# Patient Record
Sex: Male | Born: 1970 | Race: White | Hispanic: No | Marital: Married | Smoking: Never smoker
Health system: Southern US, Community
[De-identification: ages and names within clinical notes are randomized; demographics above are authoritative.]

## PROBLEM LIST (undated history)

## (undated) DIAGNOSIS — L409 Psoriasis, unspecified: Secondary | ICD-10-CM

## (undated) HISTORY — PX: HEMORRHOID BANDING: SHX5850

## (undated) HISTORY — DX: Psoriasis, unspecified: L40.9

## (undated) HISTORY — PX: COLONOSCOPY: SHX174

---

## 2016-02-29 ENCOUNTER — Other Ambulatory Visit: Payer: Self-pay | Admitting: Family Medicine

## 2016-02-29 DIAGNOSIS — R51 Headache: Principal | ICD-10-CM

## 2016-02-29 DIAGNOSIS — R519 Headache, unspecified: Secondary | ICD-10-CM

## 2016-03-03 ENCOUNTER — Other Ambulatory Visit: Payer: Self-pay

## 2016-03-08 ENCOUNTER — Other Ambulatory Visit: Payer: Self-pay

## 2016-03-20 HISTORY — PX: COLONOSCOPY: SHX174

## 2016-03-21 ENCOUNTER — Ambulatory Visit
Admission: RE | Admit: 2016-03-21 | Discharge: 2016-03-21 | Disposition: A | Discharge status: Home / Self Care | Payer: 59 | Source: Ambulatory Visit | Attending: Family Medicine | Admitting: Family Medicine

## 2016-03-21 DIAGNOSIS — R51 Headache: Secondary | ICD-10-CM | POA: Diagnosis not present

## 2016-03-21 DIAGNOSIS — R519 Headache, unspecified: Secondary | ICD-10-CM

## 2016-03-27 DIAGNOSIS — K642 Third degree hemorrhoids: Secondary | ICD-10-CM | POA: Diagnosis not present

## 2016-03-27 DIAGNOSIS — K921 Melena: Secondary | ICD-10-CM | POA: Diagnosis not present

## 2016-04-20 DIAGNOSIS — K648 Other hemorrhoids: Secondary | ICD-10-CM | POA: Diagnosis not present

## 2016-05-11 ENCOUNTER — Ambulatory Visit: Payer: Self-pay | Admitting: Family Medicine

## 2016-05-25 DIAGNOSIS — K642 Third degree hemorrhoids: Secondary | ICD-10-CM | POA: Diagnosis not present

## 2016-06-08 DIAGNOSIS — K642 Third degree hemorrhoids: Secondary | ICD-10-CM | POA: Diagnosis not present

## 2016-09-26 DIAGNOSIS — K642 Third degree hemorrhoids: Secondary | ICD-10-CM | POA: Diagnosis not present

## 2016-11-11 ENCOUNTER — Emergency Department (HOSPITAL_COMMUNITY): Payer: 59

## 2016-11-11 ENCOUNTER — Encounter (HOSPITAL_COMMUNITY): Payer: Self-pay | Admitting: Emergency Medicine

## 2016-11-11 ENCOUNTER — Emergency Department (HOSPITAL_COMMUNITY)
Admission: EM | Admit: 2016-11-11 | Discharge: 2016-11-11 | Disposition: A | Discharge status: Home / Self Care | Payer: 59 | Attending: Emergency Medicine | Admitting: Emergency Medicine

## 2016-11-11 DIAGNOSIS — R0789 Other chest pain: Secondary | ICD-10-CM | POA: Diagnosis not present

## 2016-11-11 DIAGNOSIS — S2242XA Multiple fractures of ribs, left side, initial encounter for closed fracture: Secondary | ICD-10-CM | POA: Diagnosis not present

## 2016-11-11 DIAGNOSIS — Y929 Unspecified place or not applicable: Secondary | ICD-10-CM | POA: Insufficient documentation

## 2016-11-11 DIAGNOSIS — S20412A Abrasion of left back wall of thorax, initial encounter: Secondary | ICD-10-CM | POA: Diagnosis not present

## 2016-11-11 DIAGNOSIS — Y998 Other external cause status: Secondary | ICD-10-CM | POA: Insufficient documentation

## 2016-11-11 DIAGNOSIS — Y939 Activity, unspecified: Secondary | ICD-10-CM | POA: Insufficient documentation

## 2016-11-11 DIAGNOSIS — Z79899 Other long term (current) drug therapy: Secondary | ICD-10-CM | POA: Diagnosis not present

## 2016-11-11 DIAGNOSIS — W19XXXA Unspecified fall, initial encounter: Secondary | ICD-10-CM | POA: Insufficient documentation

## 2016-11-11 LAB — URINALYSIS, ROUTINE W REFLEX MICROSCOPIC
BACTERIA UA: NONE SEEN
BILIRUBIN URINE: NEGATIVE
Glucose, UA: NEGATIVE mg/dL
HGB URINE DIPSTICK: NEGATIVE
KETONES UR: NEGATIVE mg/dL
LEUKOCYTES UA: NEGATIVE
NITRITE: NEGATIVE
PH: 7 (ref 5.0–8.0)
SPECIFIC GRAVITY, URINE: 1.015 (ref 1.005–1.030)
SQUAMOUS EPITHELIAL / LPF: NONE SEEN

## 2016-11-11 MED ORDER — KETOROLAC TROMETHAMINE 30 MG/ML IJ SOLN
15.0000 mg | Freq: Once | INTRAMUSCULAR | Status: AC
  Administered 2016-11-11: 15 mg via INTRAMUSCULAR
  Filled 2016-11-11: qty 1

## 2016-11-11 MED ORDER — HYDROCODONE-ACETAMINOPHEN 5-325 MG PO TABS: 1.0000 | ORAL_TABLET | Freq: Four times a day (QID) | ORAL | 0 refills | Status: DC | PRN

## 2016-11-11 MED ORDER — IBUPROFEN 400 MG PO TABS: 400.0000 mg | ORAL_TABLET | Freq: Three times a day (TID) | ORAL | 0 refills | Status: AC

## 2016-11-11 NOTE — ED Triage Notes (Addendum)
Pt reports he fell down a flight of stairs at midnight last night. Pt reports L side chest wall pain. Pt also reports he has a bruise on the back of his head from hitting head. May have had LOC. Not on blood thinners. Denies neck pain. Ambulatory to triage. Abrasion noted to L flank area.

## 2016-11-11 NOTE — ED Notes (Signed)
Pt instructed and demonstrated the use of incentive spirometer, able to preform without difficulty to 2500 ml. Pt instructed to use every 2 hours while awake and verbal agrees.

## 2016-11-11 NOTE — ED Provider Notes (Signed)
[facility]-EMERGENCY DEPT Provider Note   CSN: [id number] Arrival date & time: 11/11/16  0727     History   Chief Complaint Chief Complaint  Patient presents with  . Fall    HPI RAVI KAUK is a 46 y.o. male.   Fall     Patient presents the morning after a fall. Patient is healthy, takes no blood thinning medication, was in his usual state of health. Last night, approximately 5 hours ago the patient slipped, fell onto the top of the staircase, and subsequently down the stairs. He recalls hitting his left thoracic paraspinal area, left  Inferior axilla, and posterior head. No loss of consciousness. No subsequent confusion, disorientation, neck pain, vision changes, weakness in any extremity. No relief from his severe sore cramp-like pain that is along the left side, with ibuprofen.   History reviewed. No pertinent past medical history.  There are no active problems to display for this patient.   History reviewed. No pertinent surgical history.     Home Medications    Prior to Admission medications   Medication Sig Start Date End Date Taking? Authorizing Provider  acetaminophen (TYLENOL) 500 MG tablet Take 1,000-1,500 mg by mouth every 6 (six) hours as needed for mild pain or moderate pain.   Yes [provider]  Chlorpheniramine-PSE-Ibuprofen (ADVIL ALLERGY SINUS) 2-30-200 MG TABS Take 1 tablet by mouth daily as needed (congestion).   Yes [provider]    Family History History reviewed. No pertinent family history.  Social History Social History  Substance Use Topics  . Smoking status: Never Smoker  . Smokeless tobacco: Never Used  . Alcohol use No     Allergies   Patient has no known allergies.   Review of Systems Review of Systems  Constitutional:       Per HPI, otherwise negative  HENT:       Per HPI, otherwise negative  Respiratory:       Per HPI, otherwise negative  Cardiovascular:       Per HPI, otherwise  negative  Gastrointestinal: Negative for vomiting.  Endocrine:       Negative aside from HPI  Genitourinary:       Neg aside from HPI   Musculoskeletal:       Per HPI, otherwise negative  Skin: Positive for color change and wound.  Neurological: Negative for syncope and weakness.     Physical Exam Updated Vital Signs BP (!) 144/94 (BP Location: Left Arm)   Pulse 100   Temp 97.9 F (36.6 C) (Oral)   Resp 20   SpO2 97%   Physical Exam  Constitutional: He is oriented to person, place, and time. He appears well-developed. No distress.  HENT:  Head: Normocephalic and atraumatic.  No appreciable trauma posterior scalp, the patient describes a small hematoma.  Eyes: Conjunctivae and EOM are normal.  Neck: No spinous process tenderness and no muscular tenderness present. No neck rigidity. No edema, no erythema and normal range of motion present.  Cardiovascular: Normal rate and regular rhythm.   Pulmonary/Chest: Effort normal. No stridor. No respiratory distress.      Abdominal: He exhibits no distension.  Musculoskeletal: He exhibits no edema.  Neurological: He is alert and oriented to person, place, and time. He displays no atrophy and no tremor. He exhibits normal muscle tone. He displays no seizure activity. Coordination normal.  Skin: Skin is warm and dry.     Psychiatric: He has a normal mood and affect.  Nursing note  and vitals reviewed.    ED Treatments / Results   Radiology Dg Chest 2 View  Result Date: 11/11/2016 CLINICAL DATA:  Left flank and left rib pain.  Fall last night. EXAM: CHEST  2 VIEW COMPARISON:  None. FINDINGS: There is opacity in the left base which is somewhat platelike in streaky appearance. More focal opacity is seen in the right infrahilar region on the frontal view. No other infiltrate identified. No nodule or mass. The cardiomediastinal silhouette is unremarkable. There is a fracture through the posterolateral left eighth and ninth ribs. The  seventh rib may be involved but is less certain. No other acute abnormalities. IMPRESSION: 1. Left-sided rib fractures as above. 2. Streaky opacity in left base is probably atelectasis. 3. More focal opacity in the right infrahilar region could be atelectasis or infiltrate. Recommend short-term follow-up to ensure resolution. Electronically Signed   By: Gerome Sam III M.D   On: 11/11/2016 08:12   Ct Chest Wo Contrast  Result Date: 11/11/2016 CLINICAL DATA:  Larey Seat down stairs last night. Chest pain. Multiple left rib fractures. Initial encounter. EXAM: CT CHEST WITHOUT CONTRAST TECHNIQUE: Multidetector CT imaging of the chest was performed following the standard protocol without IV contrast. COMPARISON:  None. FINDINGS: Cardiovascular: No evidence of mediastinal hematoma. No pericardial effusion. No evidence thoracic aortic aneurysm. Mediastinum/Nodes: No masses or pathologically enlarged lymph nodes identified. Lungs/Pleura: No evidence of pulmonary contusion or other infiltrate. No evidence of pneumothorax or hemothorax. Linear opacities in the dependent portions of both lower lobes may be due to atelectasis or scarring. Upper Abdomen:  No acute findings. Musculoskeletal: Minimally displaced fractures of left lateral seventh through ninth ribs seen. No other fractures identified. IMPRESSION: Minimally displaced fractures of left lateral seventh through ninth ribs. No other traumatic injury identified. Dependent bilateral lower lobe atelectasis versus infiltrates . Electronically Signed   By: Myles Rosenthal M.D.   On: 11/11/2016 10:17    Procedures Procedures (including critical care time)  Medications Ordered in ED Medications  ketorolac (TORADOL) 30 MG/ML injection 15 mg (not administered)     Initial Impression / Assessment and Plan / ED Course  I have reviewed the triage vital signs and the nursing notes.  Pertinent labs & imaging results that were available during my care of the patient were  reviewed by me and considered in my medical decision making (see chart for details).   On repeat exam the patient is awake and alert. I discussed all findings with the patient and his wife. With x-ray evidence, CT evidence of 3 rib fractures, but no evidence for pneumothorax, hemothorax, nor urinalysis suggesting renal injury, the patient is appropriate for close outpatient follow-up. Patient has received both analgesia and incentive spirometry with instructions on use.  Patient did strike his head, but is not on anticoagulant, has no neurologic deficiencies, and no appreciable evidence for trauma. C-spine evaluation also reassuring.    Final Clinical Impressions(s) / ED Diagnoses   Final diagnoses:  Fall, initial encounter  Closed fracture of multiple ribs of left side, initial encounter    New Prescriptions New Prescriptions   HYDROCODONE-ACETAMINOPHEN (NORCO/VICODIN) 5-325 MG TABLET    Take 1 tablet by mouth every 6 (six) hours as needed for severe pain.   IBUPROFEN (ADVIL,MOTRIN) 400 MG TABLET    Take 1 tablet (400 mg total) by mouth 3 (three) times daily. Take one tablet three times daily for three days     Gerhard Munch, MD 11/11/16 1130

## 2017-11-07 DIAGNOSIS — Z125 Encounter for screening for malignant neoplasm of prostate: Secondary | ICD-10-CM | POA: Diagnosis not present

## 2017-11-07 DIAGNOSIS — Z0000 Encounter for general adult medical examination without abnormal findings: Secondary | ICD-10-CM | POA: Diagnosis not present

## 2017-11-07 DIAGNOSIS — Z23 Encounter for immunization: Secondary | ICD-10-CM | POA: Diagnosis not present

## 2017-12-03 DIAGNOSIS — H1030 Unspecified acute conjunctivitis, unspecified eye: Secondary | ICD-10-CM | POA: Diagnosis not present

## 2018-09-13 IMAGING — CT CT CHEST W/O CM
2 of 4 series · 15 of 36 positions shown, 18 images · non-contrast
Comparison: None.

CLINICAL DATA: Fell down stairs last night. Chest pain. Multiple
left rib fractures. Initial encounter.

EXAM:
CT CHEST WITHOUT CONTRAST
TECHNIQUE: Multidetector CT imaging of the chest was performed following the
standard protocol without IV contrast.

[Series 2: chest w/o st · axial · non-contrast · 0.84mm/px · z∈[+1590,+1862]mm · 12 of 162 slices shown, 15 images]
[im 13/162  mediastinal]
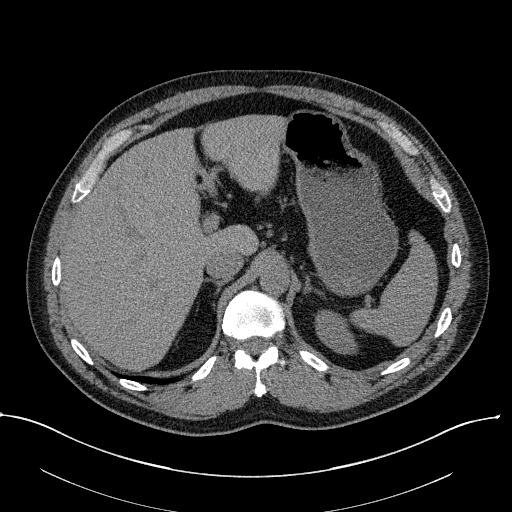
[im 13/162  lung]
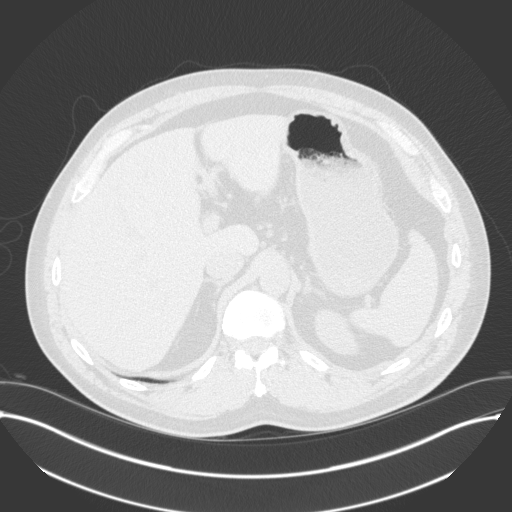
[im 25/162  lung]
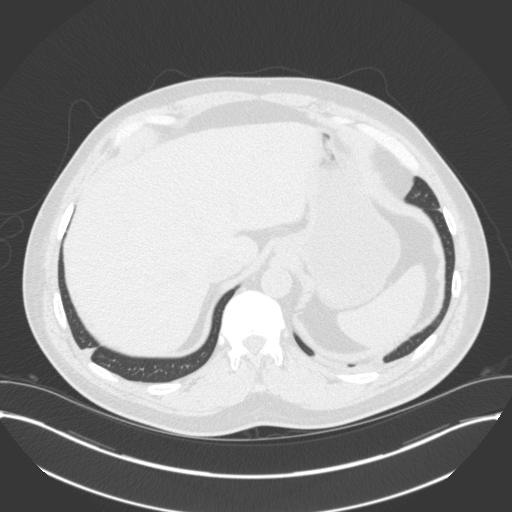
[im 38/162  lung]
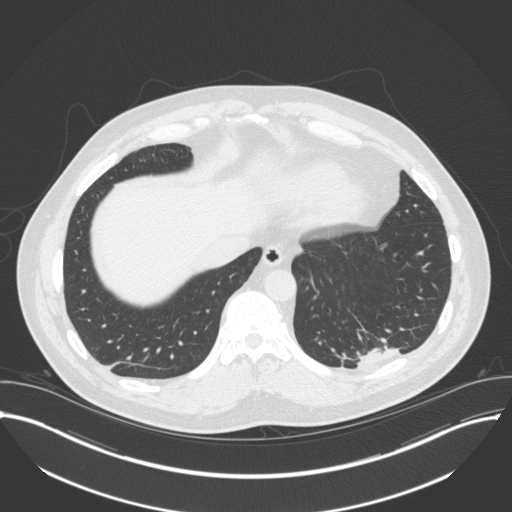
[im 50/162  lung]
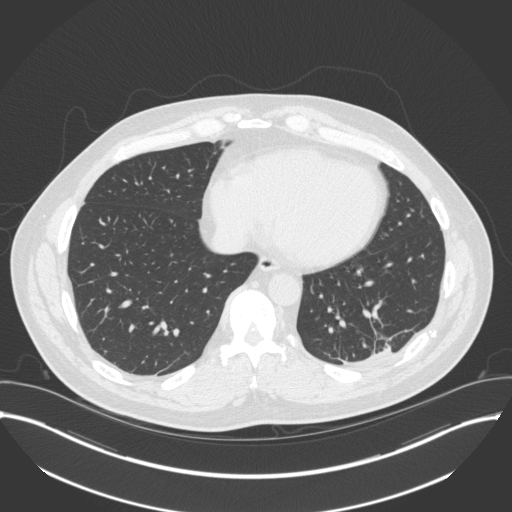
[im 62/162  mediastinal]
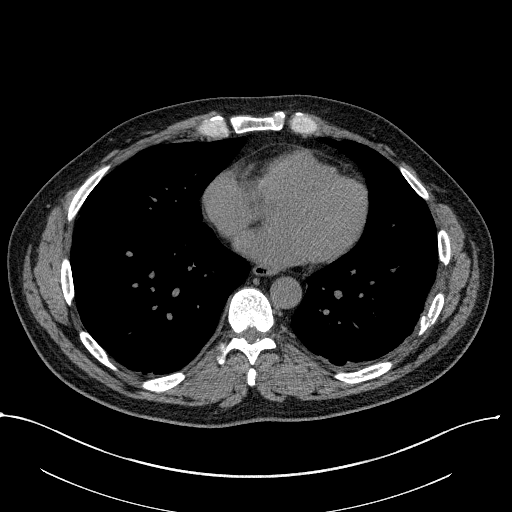
[im 62/162  lung]
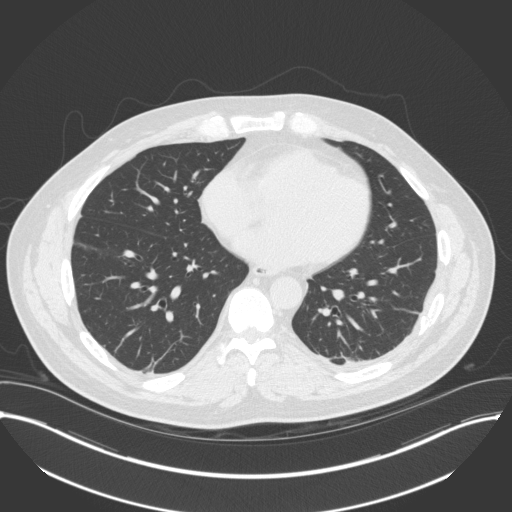
[im 75/162  lung]
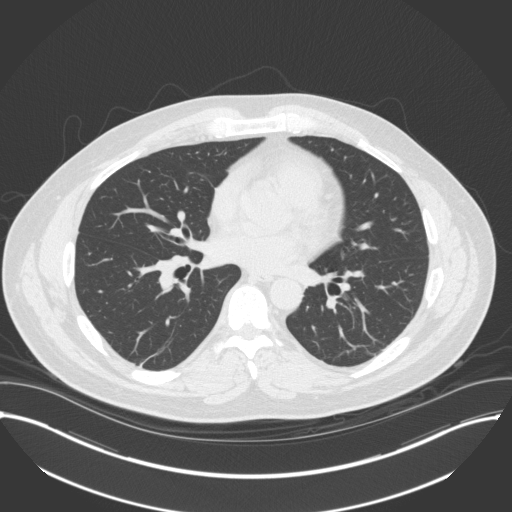
[im 87/162  lung]
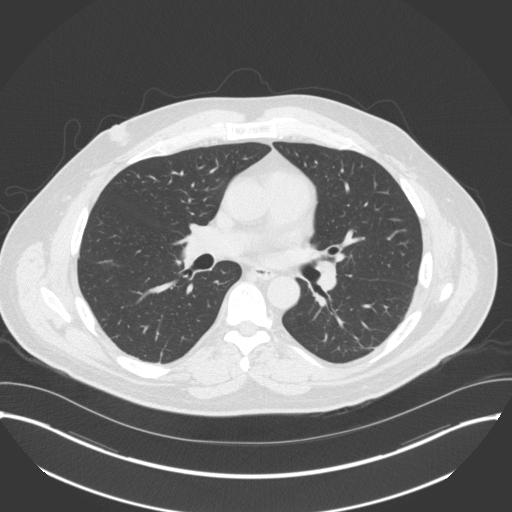
[im 100/162  lung]
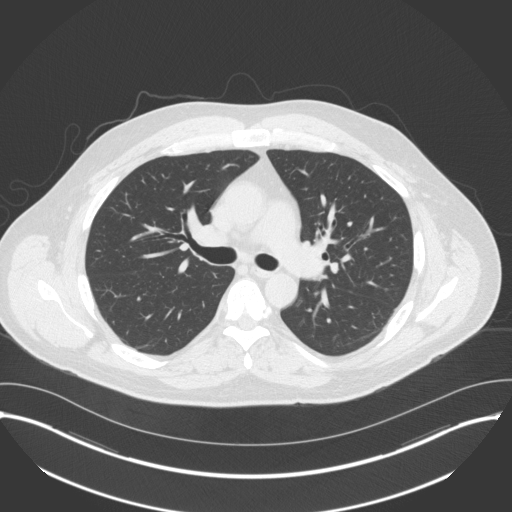
[im 112/162  mediastinal]
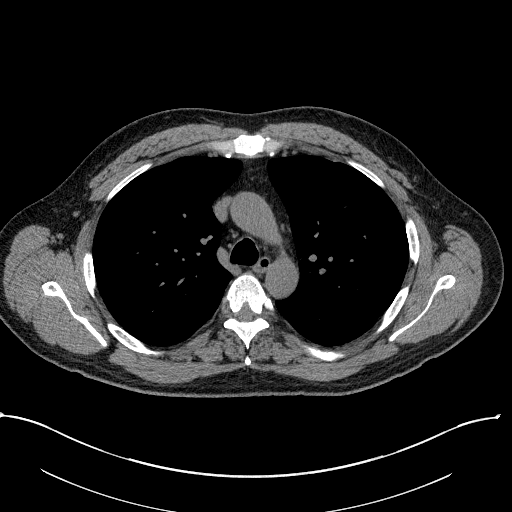
[im 112/162  lung]
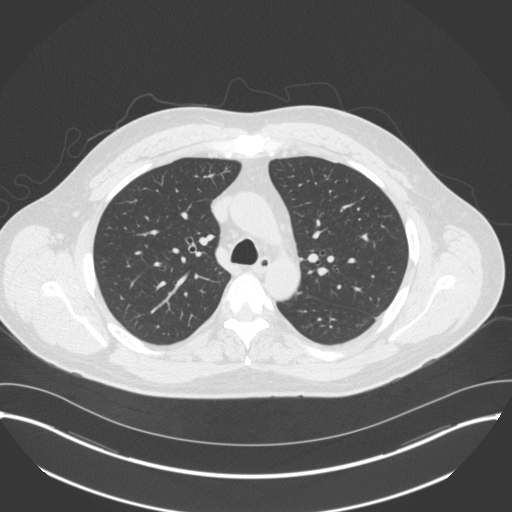
[im 124/162  lung]
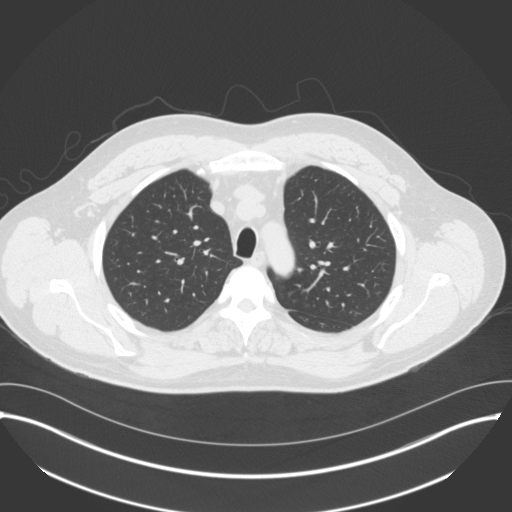
[im 137/162  lung]
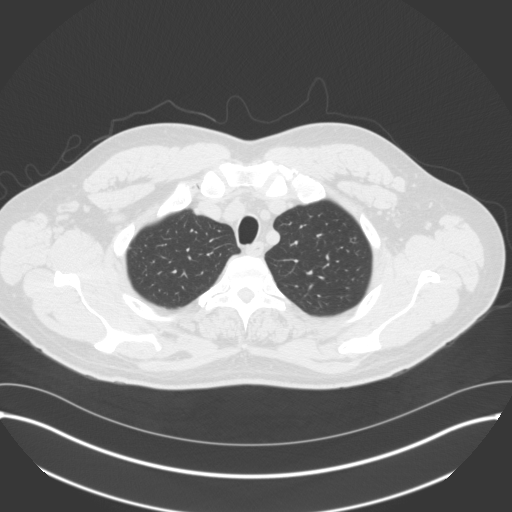
[im 149/162  lung]
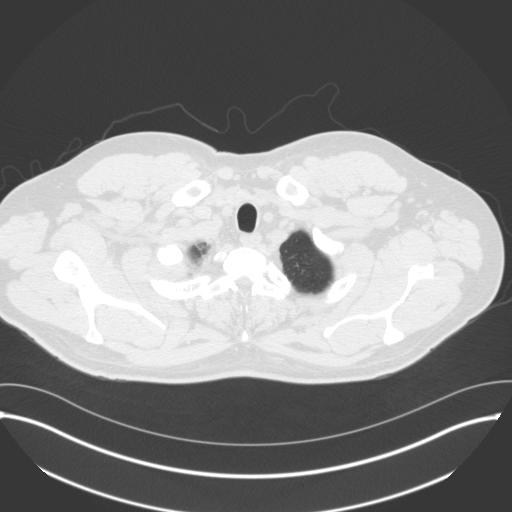

[Series 5: coronals · coronal · 0.66mm/px · 3 of 166 slices shown]
[im 34/166  lung]
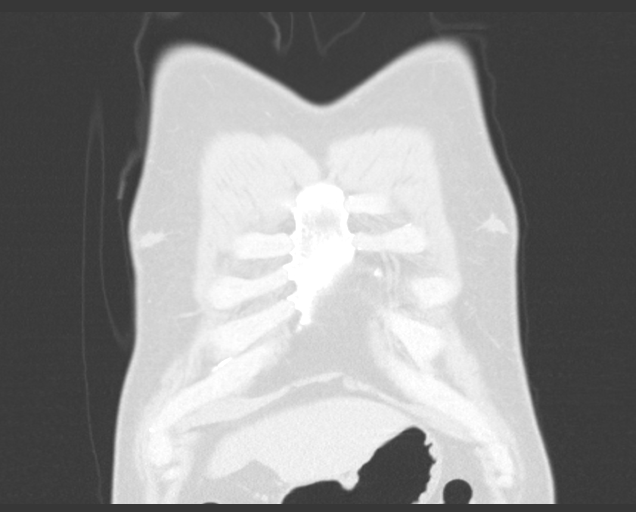
[im 67/166  lung]
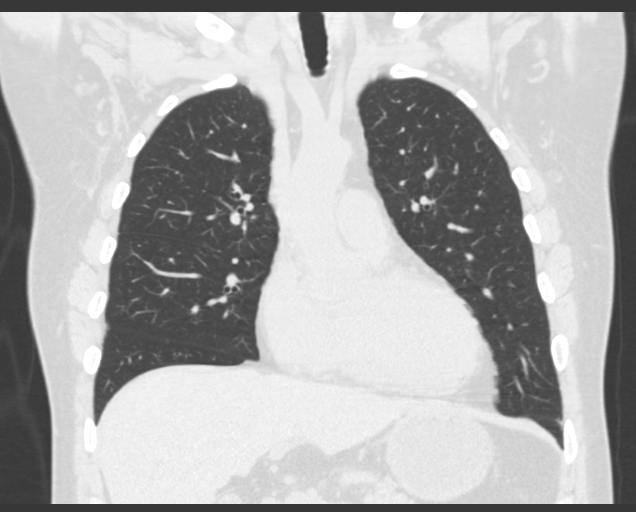
[im 100/166  lung]
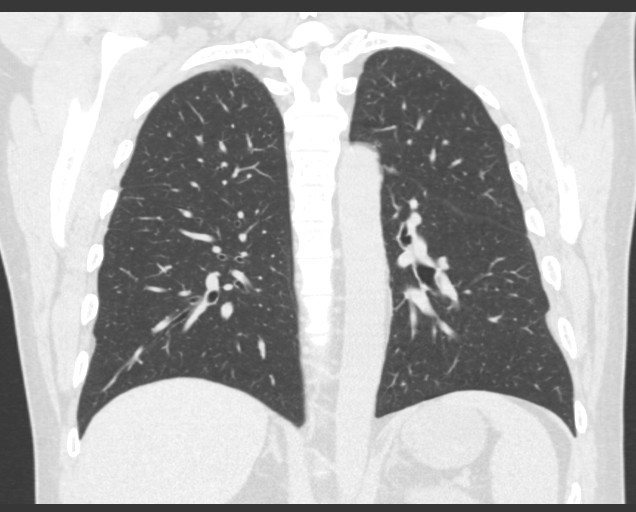

[15 of 36 positions shown; findings below may reference images not displayed]

FINDINGS: Cardiovascular: No evidence of mediastinal hematoma. No pericardial
effusion. No evidence thoracic aortic aneurysm.

Mediastinum/Nodes: No masses or pathologically enlarged lymph nodes
identified.

Lungs/Pleura: No evidence of pulmonary contusion or other
infiltrate. No evidence of pneumothorax or hemothorax. Linear
opacities in the dependent portions of both lower lobes may be due
to atelectasis or scarring.

Upper Abdomen:  No acute findings.

Musculoskeletal: Minimally displaced fractures of left lateral
seventh through ninth ribs seen. No other fractures identified.
IMPRESSION: Minimally displaced fractures of left lateral seventh through ninth
ribs. No other traumatic injury identified.

Dependent bilateral lower lobe atelectasis versus infiltrates .

## 2022-09-29 ENCOUNTER — Emergency Department
Admission: EM | Admit: 2022-09-29 | Discharge: 2022-09-29 | Disposition: A | Discharge status: Home / Self Care | Payer: Managed Care, Other (non HMO) | Attending: Emergency Medicine | Admitting: Emergency Medicine

## 2022-09-29 ENCOUNTER — Other Ambulatory Visit: Payer: Self-pay

## 2022-09-29 ENCOUNTER — Encounter: Payer: Self-pay | Admitting: Emergency Medicine

## 2022-09-29 DIAGNOSIS — Z203 Contact with and (suspected) exposure to rabies: Secondary | ICD-10-CM

## 2022-09-29 DIAGNOSIS — Z2914 Encounter for prophylactic rabies immune globin: Secondary | ICD-10-CM | POA: Insufficient documentation

## 2022-09-29 DIAGNOSIS — Z23 Encounter for immunization: Secondary | ICD-10-CM | POA: Diagnosis not present

## 2022-09-29 MED ORDER — RABIES VACCINE, PCEC IM SUSR
1.0000 mL | Freq: Once | INTRAMUSCULAR | Status: AC
  Administered 2022-09-29: 1 mL via INTRAMUSCULAR
  Filled 2022-09-29: qty 1

## 2022-09-29 MED ORDER — RABIES IMMUNE GLOBULIN 150 UNIT/ML IM INJ
1500.0000 [IU] | INJECTION | Freq: Once | INTRAMUSCULAR | Status: AC
  Administered 2022-09-29: 1500 [IU]
  Filled 2022-09-29: qty 10

## 2022-09-29 MED ORDER — RABIES IMMUNE GLOBULIN 150 UNIT/ML IM INJ
600.0000 [IU] | INJECTION | Freq: Once | INTRAMUSCULAR | Status: AC
  Administered 2022-09-29: 600 [IU]
  Filled 2022-09-29: qty 4

## 2022-09-29 MED ORDER — RABIES IMMUNE GLOBULIN 150 UNIT/ML IM INJ: 20.0000 [IU]/kg | INJECTION | Freq: Once | INTRAMUSCULAR | Status: DC

## 2022-09-29 NOTE — ED Notes (Signed)
See triage note  States he was in back  yard   felt something buzz by his head  States he did not see anything  but thinks it may have been a bat  This happened 3 weeks ago

## 2022-09-29 NOTE — Discharge Instructions (Addendum)
The rabies vaccine does not provide lifelong protection.  Protection can last anywhere from 6 months to 2 years ending on how many doses he received.  Common side effects of rabies vaccine include pain at the injection site, skin discoloration, swelling.   Please returned on day 3 for second set of rabies vaccine.

## 2022-09-29 NOTE — ED Provider Notes (Signed)
[facility] Emergency Department Provider Note     Event Date/Time   First MD Initiated Contact with Patient 09/29/22 1544     (approximate)   History   Rabies Exposure   HPI  Cory Costa is a 52 y.o. male presents to the ED for possible rabies exposure x 3 weeks ago.  Patient reports he was doing his routine running at 4:30 AM when he believes a bat possibly grazed and made contact with his head.  He was seen by his PCP who encouraged him to follow-up with ED for rabies prophylaxis.  Denies symptoms at this time.  Patient reports he is exposed to bats when he runs and other animals that carry rabies.    Physical Exam   Triage Vital Signs: ED Triage Vitals  Encounter Vitals Group     BP 09/29/22 1523 (!) 122/90     Systolic BP Percentile --      Diastolic BP Percentile --      Pulse Rate 09/29/22 1523 76     Resp 09/29/22 1523 18     Temp 09/29/22 1525 98.3 F (36.8 C)     Temp Source 09/29/22 1525 Oral     SpO2 09/29/22 1523 98 %     Weight 09/29/22 1524 234 lb (106.1 kg)     Height 09/29/22 1524 6\' 2"  (1.88 m)     Head Circumference --      Peak Flow --      Pain Score 09/29/22 1523 0     Pain Loc --      Pain Education --      Exclude from Growth Chart --     Most recent vital signs: Vitals:   09/29/22 1523 09/29/22 1525  BP: (!) 122/90   Pulse: 76   Resp: 18   Temp:  98.3 F (36.8 C)  SpO2: 98%     General Awake, no distress.  HEENT NCAT. PERRL. EOMI. Scalp visualized. No skin lesions or rashes noted. Skin is intact.  CV:  Good peripheral perfusion.  RESP:  Normal effort.  ABD:  No distention.     ED Results / Procedures / Treatments   Labs (all labs ordered are listed, but only abnormal results are displayed) Labs Reviewed - No data to display  History and physical examination do not warrant a lab work up or imaging at this time.   No results found.  PROCEDURES:  Critical Care performed:  No  Procedures   MEDICATIONS ORDERED IN ED: Medications  rabies vaccine (RABAVERT) injection 1 mL (1 mL Intramuscular Given 09/29/22 1704)  rabies immune globulin (HYPERRAB/KEDRAB) injection 1,500 Units (1,500 Units Infiltration Given 09/29/22 1703)    And  rabies immune globulin (HYPERRAB/KEDRAB) injection 600 Units (600 Units Infiltration Given 09/29/22 1704)    IMPRESSION / MDM / ASSESSMENT AND PLAN / ED COURSE  I reviewed the triage vital signs and the nursing notes.                               52 y.o. male presents to the emergency department for evaluation of possible rabies exposure from a bat while running outside under trees. See HPI for further details.   A thorough discussion was provided. It was shared decision making in considering rabies vaccine as a prophylactic given possible rabies exposure.  The patient was administered rabies immune globulin and rabies vaccine today.  He  is to return on days 3 for second rabies vaccine (07/15).  Education package of rabies vaccine and the CDC guideline is provided for patient. All questions and concerns were addressed during this ED visit.    Patient's presentation is most consistent with acute presentation with potential threat to life or bodily function.  FINAL CLINICAL IMPRESSION(S) / ED DIAGNOSES   Final diagnoses:  Rabies exposure   Rx / DC Orders   ED Discharge Orders     None        Note:  This document was prepared using Dragon voice recognition software and may include unintentional dictation errors.    Romeo Apple, Raywood Wailes A, PA-C 09/30/22 1610    Dionne Bucy, MD 09/30/22 [phone]

## 2022-09-29 NOTE — ED Triage Notes (Signed)
Pt here with a possible rabies exposure. Pt was outside at dusk on June 20 and states a bat grazed his head and he was advised to come and be evaluated at the ED. Pt denies any symptoms.

## 2022-10-02 ENCOUNTER — Ambulatory Visit
Admission: RE | Admit: 2022-10-02 | Discharge: 2022-10-02 | Disposition: A | Discharge status: Home / Self Care | Payer: Managed Care, Other (non HMO) | Source: Ambulatory Visit | Attending: Internal Medicine | Admitting: Internal Medicine

## 2022-10-02 DIAGNOSIS — Z23 Encounter for immunization: Secondary | ICD-10-CM | POA: Diagnosis not present

## 2022-10-02 MED ORDER — RABIES VACCINE, PCEC IM SUSR
1.0000 mL | Freq: Once | INTRAMUSCULAR | Status: AC
  Administered 2022-10-02: 1 mL via INTRAMUSCULAR

## 2022-10-02 NOTE — ED Triage Notes (Signed)
Here for rabies vaccine day 3. Denies any reactions to previous injections. Did not see instructions for follow up on days 7 and 14 documented in HPI. Clarified orders with Nettie Elm PA, who clarified them with her attending. Instructed patient on follow up on days 7 (7/19) and 14 (7/26) for continuing vaccine regimen.

## 2022-10-03 MED FILL — Rabies Immune Globulin (Human) Inj 300 Unit/2ML (150 Unt/ML): INTRAMUSCULAR | Qty: 4 | Status: AC

## 2022-10-06 ENCOUNTER — Ambulatory Visit
Admission: RE | Admit: 2022-10-06 | Discharge: 2022-10-06 | Disposition: A | Discharge status: Home / Self Care | Payer: Managed Care, Other (non HMO) | Source: Ambulatory Visit | Attending: Internal Medicine | Admitting: Internal Medicine

## 2022-10-06 DIAGNOSIS — Z23 Encounter for immunization: Secondary | ICD-10-CM

## 2022-10-06 MED ORDER — RABIES VACCINE, PCEC IM SUSR
1.0000 mL | Freq: Once | INTRAMUSCULAR | Status: AC
  Administered 2022-10-06: 1 mL via INTRAMUSCULAR

## 2022-10-06 NOTE — ED Triage Notes (Signed)
Patient presents to UC for rabies vaccine day 7. States previous doses tolerated well.

## 2022-10-06 NOTE — ED Notes (Signed)
Rabies IM administered, tolerated well. Instructed patient to follow-up day 14 for last dose. Voiced understanding.

## 2022-10-13 ENCOUNTER — Ambulatory Visit
Admission: RE | Admit: 2022-10-13 | Discharge: 2022-10-13 | Disposition: A | Discharge status: Home / Self Care | Payer: Managed Care, Other (non HMO) | Source: Ambulatory Visit | Attending: Emergency Medicine | Admitting: Emergency Medicine

## 2022-10-13 DIAGNOSIS — Z203 Contact with and (suspected) exposure to rabies: Secondary | ICD-10-CM

## 2022-10-13 MED ORDER — RABIES VACCINE, PCEC IM SUSR
1.0000 mL | Freq: Once | INTRAMUSCULAR | Status: AC
  Administered 2022-10-13: 1 mL via INTRAMUSCULAR

## 2022-10-13 NOTE — ED Notes (Signed)
Tolerated vaccine administration into right deltoid without issue. Advised to follow-up if need arises.

## 2022-10-13 NOTE — ED Triage Notes (Signed)
Patient to UC for final rabies vaccine in rabies series after possibel rabies exposure via bat June 20th.

## 2022-10-17 ENCOUNTER — Telehealth: Payer: Self-pay | Admitting: Family Medicine

## 2022-10-17 NOTE — Telephone Encounter (Signed)
Ok to establish new pt appt with me. Thanks.

## 2022-10-17 NOTE — Telephone Encounter (Signed)
Scheduled pt for 11/14/22

## 2022-10-17 NOTE — Telephone Encounter (Signed)
Pt called requesting to become a new pt of Dr. Reece Agar? Pt states his father, Yacine Westenhaver, is already a current pt of Dr. Reece Agar. Is it okay to schedule new pt ov? Call back # [phone]

## 2022-11-14 ENCOUNTER — Ambulatory Visit: Payer: Managed Care, Other (non HMO) | Admitting: Family Medicine

## 2022-11-14 VITALS — BP 110/70 | HR 62 | Temp 98.0°F | Ht 74.0 in | Wt 235.0 lb

## 2022-11-14 DIAGNOSIS — Z6830 Body mass index (BMI) 30.0-30.9, adult: Secondary | ICD-10-CM | POA: Diagnosis not present

## 2022-11-14 DIAGNOSIS — E66811 Obesity, class 1: Secondary | ICD-10-CM | POA: Insufficient documentation

## 2022-11-14 DIAGNOSIS — L409 Psoriasis, unspecified: Secondary | ICD-10-CM | POA: Diagnosis not present

## 2022-11-14 DIAGNOSIS — K645 Perianal venous thrombosis: Secondary | ICD-10-CM

## 2022-11-14 DIAGNOSIS — E669 Obesity, unspecified: Secondary | ICD-10-CM

## 2022-11-14 MED ORDER — HYDROCORTISONE (PERIANAL) 2.5 % EX CREA: 1.0000 | TOPICAL_CREAM | Freq: Two times a day (BID) | CUTANEOUS | 1 refills | Status: DC | PRN

## 2022-11-14 NOTE — Assessment & Plan Note (Signed)
Reviewed overall healthy diet. He is planning to restart running regimen.

## 2022-11-14 NOTE — Assessment & Plan Note (Signed)
This is followed by dermatology

## 2022-11-14 NOTE — Progress Notes (Signed)
Ph: [phone] Fax: [phone]   Patient ID: Cory Costa, male    DOB: 01/05/1971, 52 y.o.   MRN: [id number]  This visit was conducted in person.  BP 110/70   Pulse 62   Temp 98 F (36.7 C) (Temporal)   Ht 6\' 2"  (1.88 m)   Wt 235 lb (106.6 kg)   SpO2 100%   BMI 30.17 kg/m    CC: new pt to establish care  Subjective:   HPI: Cory Costa is a 52 y.o. male presenting on 11/14/2022 for Establish Care   New pt to establish. Previously saw Dr Manus Gunning at [facility]. I see his father Cory Costa.  Works for [employer].  Just got back from cruise.  H/o rabies exposure (bat grazed head while running) last month s/p rabies immunoglobulin x4.   Current hemorrhoid acutely flared over the weekend while working in the yard. Very painful on Sunday but now it is improving. Treats with sugar crystals and petroleum jelly, warm sitz baths.   Colonoscopy through [facility] ~age 21 yo WNL  H/o banding without benefit.   Psoriasis managed with triamcinolone cream followed by Lucretia Field at [facility]).   Last CPE was last year.   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 Interim medical history since our last visit reviewed. Allergies and medications reviewed and updated. Outpatient Medications Prior to Visit  Medication Sig Dispense Refill   acetaminophen (TYLENOL) 500 MG tablet Take 1,000-1,500 mg by mouth every 6 (six) hours as needed for mild pain or moderate pain.     Chlorpheniramine-PSE-Ibuprofen (ADVIL ALLERGY SINUS) 2-30-200 MG TABS Take 1 tablet by mouth daily as needed (congestion). (Patient not taking: Reported on 11/14/2022)     No facility-administered medications prior to  visit.    Past Medical History:  Diagnosis Date   Psoriasis    sees dermatology    Past Surgical History:  Procedure Laterality Date   COLONOSCOPY     [facility]   HEMORRHOID BANDING     [facility]    Social History   Tobacco Use   Smoking status: Never   Smokeless tobacco: Never  Substance Use Topics   Alcohol use: Yes    Comment: socially   Drug use: Never    Family History  Problem Relation Age of Onset   Breast cancer Mother    Hypertension Father    Diabetes Neg Hx    Stroke Neg Hx    CAD Neg Hx     Per HPI unless specifically indicated in ROS section below Review of Systems  Objective:  BP 110/70   Pulse 62   Temp 98 F (36.7 C) (Temporal)   Ht 6\' 2"  (1.88 m)   Wt 235 lb (106.6 kg)   SpO2 100%   BMI 30.17 kg/m   Wt Readings from Last 3 Encounters:  11/14/22 235 lb (106.6 kg)  09/29/22 234 lb (106.1 kg)      Physical Exam Vitals and nursing note reviewed.  Constitutional:      Appearance: Normal appearance. He is not ill-appearing.  HENT:     Head: Normocephalic and atraumatic.     Mouth/Throat:     Mouth: Mucous membranes are moist.  Pharynx: Oropharynx is clear. No oropharyngeal exudate or posterior oropharyngeal erythema.  Eyes:     Extraocular Movements: Extraocular movements intact.     Conjunctiva/sclera: Conjunctivae normal.     Pupils: Pupils are equal, round, and reactive to light.  Neck:     Thyroid: No thyroid mass or thyromegaly.  Cardiovascular:     Rate and Rhythm: Normal rate and regular rhythm.     Pulses: Normal pulses.     Heart sounds: Normal heart sounds. No murmur heard. Pulmonary:     Effort: Pulmonary effort is normal. No respiratory distress.     Breath sounds: Normal breath sounds. No wheezing, rhonchi or rales.  Genitourinary:    Rectum: External hemorrhoid (thrombosed with surrounding inflammation) present. No mass, anal fissure or internal hemorrhoid.    Musculoskeletal:     Cervical back: Normal range of  motion and neck supple.     Right lower leg: No edema.     Left lower leg: No edema.  Skin:    General: Skin is warm and dry.     Findings: No rash.  Neurological:     Mental Status: He is alert.  Psychiatric:        Mood and Affect: Mood normal.        Behavior: Behavior normal.        Assessment & Plan:   Problem List Items Addressed This Visit     Psoriasis - Primary    This is followed by dermatology       External hemorrhoid, thrombosed    Noted on exam today.  Given improvement over the last 24 hours, don't recommend hemorrhoid incision/evacuation.  Supportive measures reviewed including warm sitz bath, preventative measures reviewed, handout provided.  Rx anusol HC cream with instructions how to use.  Update if not improving with treatment.       Obesity, Class I, BMI 30-34.9    Reviewed overall healthy diet. He is planning to restart running regimen.         Meds ordered this encounter  Medications   hydrocortisone (ANUSOL-HC) 2.5 % rectal cream    Sig: Place 1 Application rectally 2 (two) times daily as needed for hemorrhoids or anal itching.    Dispense:  30 g    Refill:  1    No orders of the defined types were placed in this encounter.   Patient Instructions  We will request colonoscopy records from [place].  You do have external hemorrhoid that was thrombosed. As it's getting better, continue supportive measures as up to now.  Continue sitz baths, may try steroid cream sent to pharmacy.  Let us know if not improving.  Good to see you today Return as needed or in summer 2025 for physical   Follow up plan: Return in about 1 year (around 11/14/2023), or if symptoms worsen or fail to improve, for annual exam, prior fasting for blood work.  Eustaquio Boyden, MD

## 2022-11-14 NOTE — Patient Instructions (Addendum)
We will request colonoscopy records from [place].  You do have external hemorrhoid that was thrombosed. As it's getting better, continue supportive measures as up to now.  Continue sitz baths, may try steroid cream sent to pharmacy.  Let us know if not improving.  Good to see you today Return as needed or in summer 2025 for physical

## 2022-11-14 NOTE — Assessment & Plan Note (Addendum)
Noted on exam today.  Given improvement over the last 24 hours, don't recommend hemorrhoid incision/evacuation.  Supportive measures reviewed including warm sitz bath, preventative measures reviewed, handout provided.  Rx anusol HC cream with instructions how to use.  Update if not improving with treatment.

## 2022-11-16 ENCOUNTER — Encounter (HOSPITAL_BASED_OUTPATIENT_CLINIC_OR_DEPARTMENT_OTHER): Payer: Self-pay

## 2022-11-16 ENCOUNTER — Other Ambulatory Visit: Payer: Self-pay

## 2022-11-16 ENCOUNTER — Emergency Department (HOSPITAL_BASED_OUTPATIENT_CLINIC_OR_DEPARTMENT_OTHER)
Admission: EM | Admit: 2022-11-16 | Discharge: 2022-11-17 | Disposition: A | Discharge status: Home / Self Care | Payer: Managed Care, Other (non HMO) | Attending: Emergency Medicine | Admitting: Emergency Medicine

## 2022-11-16 ENCOUNTER — Telehealth: Payer: Self-pay | Admitting: Family Medicine

## 2022-11-16 DIAGNOSIS — K649 Unspecified hemorrhoids: Secondary | ICD-10-CM | POA: Insufficient documentation

## 2022-11-16 NOTE — ED Triage Notes (Signed)
Pt states that he has history of hemorrhoids. He noticed a new one last night that has caused "excruciating" pain and also some "leaking of blood".

## 2022-11-16 NOTE — Telephone Encounter (Signed)
If hemorrhoid is thrombosed--it may be worth doing an I&D

## 2022-11-16 NOTE — Telephone Encounter (Signed)
FYI: This call has been transferred to Access Nurse. Once the result note has been entered staff can address the message at that time.  Patient called in with the following symptoms:  Red Word: adverse reaction to hemorrhoid cream,burning ,larger hemorrhoid coming about   Please advise at Mobile [phone] (mobile)  Message is routed to Provider Pool and [facility] Triage

## 2022-11-16 NOTE — ED Provider Notes (Signed)
  [facility]MENT AT [facility] Provider Note   CSN: [id number] Arrival date & time: 11/16/22  1847     History  Chief Complaint  Patient presents with   Hemorrhoids    Cory Costa is a 52 y.o. male.  Patient is a 52 year old male with history of hemorrhoids.  Patient presenting today with complaints of rectal pain.  He was prescribed hydrocortisone cream by his primary doctor which he applied, then things suddenly became worse.  He is concerned he may have had an allergic reaction to the hydrocortisone cream.  He called his doctor who advised him to come to the ER for surgical consultation.  Patient denying any fevers or chills.  The history is provided by the patient.       Home Medications Prior to Admission medications   Medication Sig Start Date End Date Taking? Authorizing Provider  hydrocortisone (ANUSOL-HC) 2.5 % rectal cream Place 1 Application rectally 2 (two) times daily as needed for hemorrhoids or anal itching. 11/14/22   Eustaquio Boyden, MD      Allergies    Patient has no known allergies.    Review of Systems   Review of Systems  All other systems reviewed and are negative.   Physical Exam Updated Vital Signs BP (!) 137/95 (BP Location: Left Arm)   Pulse 67   Temp 98 F (36.7 C) (Oral)   Resp 18   SpO2 99%  Physical Exam Vitals and nursing note reviewed.  Constitutional:      Appearance: Normal appearance.  Pulmonary:     Effort: Pulmonary effort is normal.  Genitourinary:    Comments: There are 2 large hemorrhoids noted. Skin:    General: Skin is warm and dry.  Neurological:     Mental Status: He is alert and oriented to person, place, and time.     ED Results / Procedures / Treatments   Labs (all labs ordered are listed, but only abnormal results are displayed) Labs Reviewed - No data to display  EKG None  Radiology No results found.  Procedures Procedures    Medications Ordered in  ED Medications - No data to display  ED Course/ Medical Decision Making/ A&P  Patient was sent by his primary doctor for evaluation of hemorrhoids.    On exam, he does have 2 large hemorrhoids noted.  1 appears inflamed, the other with slight bleeding.  He was advised to come to the ER by his primary doctor for surgical consultation.  I have spoken with Dr. Janee Morn from general surgery who agrees with me that this is not an emergent situation.  We will advise continued use of hydrocortisone cream, Preparation H, ice packs, and will provide lidocaine jelly.  Dr. Janee Morn also feels as though he can be seen tomorrow in the urgent clinic at the surgery office.  Patient is to call and make these arrangements.  Final Clinical Impression(s) / ED Diagnoses Final diagnoses:  None    Rx / DC Orders ED Discharge Orders     None         Geoffery Lyons, MD 11/17/22 0006

## 2022-11-16 NOTE — Telephone Encounter (Signed)
I spoke with Cory Costa; and Cory Costa said he is presently using a sitz bath to see if can get relief from burning sensation. Cory Costa said that if does not get some relief Cory Costa will go to UC this evening but Cory Costa scheduled appt with Dr Alphonsus Sias on 11/17/22 at 12 noon if Cory Costa can wait and be seen tomorrow. UC & ED precautions given and Cory Costa voiced understanding.Cory Costa said the hemorrhoid is bleeding slightly also.Cory Costa saw Dr Reece Agar on 11/14/22. Sending note to Dr Reece Agar who is out of office, and Dr Alphonsus Sias who is in office.

## 2022-11-17 ENCOUNTER — Ambulatory Visit: Payer: Managed Care, Other (non HMO) | Admitting: Internal Medicine

## 2022-11-17 ENCOUNTER — Encounter: Payer: Self-pay | Admitting: Internal Medicine

## 2022-11-17 VITALS — BP 116/76 | HR 67 | Temp 97.6°F | Ht 74.0 in | Wt 231.0 lb

## 2022-11-17 DIAGNOSIS — K645 Perianal venous thrombosis: Secondary | ICD-10-CM | POA: Diagnosis not present

## 2022-11-17 MED ORDER — LIDOCAINE 5 % EX OINT: 1.0000 | TOPICAL_OINTMENT | CUTANEOUS | 0 refills | Status: DC | PRN

## 2022-11-17 MED ORDER — LIDOCAINE HCL URETHRAL/MUCOSAL 2 % EX GEL
1.0000 | Freq: Once | CUTANEOUS | Status: AC
  Administered 2022-11-17: 1 via TOPICAL
  Filled 2022-11-17: qty 11

## 2022-11-17 NOTE — Assessment & Plan Note (Signed)
Some better than last night [facility] Surgery (DR Thompson's office)--didn't have anything today and may be out of the window for I&D Seemed partially thrombosed (or decompressed now) Can continue lidocaine and hydrocortisone topical Sitz baths/ice baths

## 2022-11-17 NOTE — Telephone Encounter (Signed)
Noted. Seen at ER last night, Rx lidocaine topical, rec f/u with surgical urgent care clinic today.  Would recommend hold hydrocortisone cream at this time.  Plz call for update on hemorrhoid and see if he plans to f/u with surgeon or keep appt with Dr L today.

## 2022-11-17 NOTE — Discharge Instructions (Signed)
Continue use of hydrocortisone cream and Preparation H several times daily.  Apply lidocaine jelly as needed for relief.  Begin taking Metamucil 1 heaping teaspoon in a glass of water 3 times daily along with Colace (equate stool softener) 100 mg twice daily.  Follow-up at [facility] surgery tomorrow for reevaluation.  The contact information for their office has been provided in this discharge summary for you to call and make these arrangements.  Dr. Janee Morn is aware of your situation and would like to have you seen in the urgent clinic.

## 2022-11-17 NOTE — Progress Notes (Signed)
Subjective:    Patient ID: Cory Costa, male    DOB: Feb 12, 1971, 52 y.o.   MRN: [id number]  HPI Here due to ongoing issues with hemorrhoids  Started with internal hemorrhoid about 6 years ago This past weekend--did a lot of yard work Had a lot of pain---wondered if the hemorrhoid was back So bad he had to stay in bed By 4 days ago, pain was some better  Saw Dr G---gave Rx for cortisone cream 2 days ago--he tried the cream at night (though not having a problem)--then he felt heat and burning Couldn't get comfortable Did try OTC cream--and pain was really bad Ongoing burning Went to ER last night---external hemorrhoids seen Told to set up with surgery clinic  Got lidocaine at ER Tried aleve Was able to sleep all night Pain back to 2/10 Hard if he changes positions  Current Outpatient Medications on File Prior to Visit  Medication Sig Dispense Refill   hydrocortisone (ANUSOL-HC) 2.5 % rectal cream Place 1 Application rectally 2 (two) times daily as needed for hemorrhoids or anal itching. 30 g 1   lidocaine (XYLOCAINE) 5 % ointment Apply 1 Application topically as needed. 35.44 g 0   No current facility-administered medications on file prior to visit.    No Known Allergies  Past Medical History:  Diagnosis Date   Psoriasis    sees dermatology    Past Surgical History:  Procedure Laterality Date   COLONOSCOPY     [facility]   HEMORRHOID BANDING     [facility]    Family History  Problem Relation Age of Onset   Breast cancer Mother    Hypertension Father    Diabetes Neg Hx    Stroke Neg Hx    CAD Neg Hx     Social History   Socioeconomic History   Marital status: Married    Spouse name: Not on file   Number of children: Not on file   Years of education: Not on file   Highest education level: Master's degree (e.g., MA, MS, MEng, MEd, MSW, MBA)  Occupational History   Not on file  Tobacco Use   Smoking status: Never   Smokeless tobacco: Never   Substance and Sexual Activity   Alcohol use: Yes    Comment: socially   Drug use: Never   Sexual activity: Yes  Other Topics Concern   Not on file  Social History Narrative   Lives with daughter and wife and MIL   Occ: Psychiatric nurse at [employer]   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, runs 5 mi several times a week, walks on treadmill   Diet: good water, fruits/vegetables daily    Social Determinants of Health   Financial Resource Strain: Low Risk  (11/14/2022)   Overall Financial Resource Strain (CARDIA)    Difficulty of Paying Living Expenses: Not hard at all  Food Insecurity: No Food Insecurity (11/14/2022)   Hunger Vital Sign    Worried About Running Out of Food in the Last Year: Never true    Ran Out of Food in the Last Year: Never true  Transportation Needs: No Transportation Needs (11/14/2022)   PRAPARE - Administrator, Civil Service (Medical): No    Lack of Transportation (Non-Medical): No  Physical Activity: Sufficiently Active (11/14/2022)   Exercise Vital Sign    Days of Exercise per Week: 4 days    Minutes of Exercise per Session: 60 min  Stress: No Stress Concern Present (11/14/2022)  [employer] of Occupational Health - Occupational Stress Questionnaire    Feeling of Stress : Only a little  Social Connections: Socially Integrated (11/14/2022)   Social Connection and Isolation Panel [NHANES]    Frequency of Communication with Friends and Family: More than three times a week    Frequency of Social Gatherings with Friends and Family: Twice a week    Attends Religious Services: More than 4 times per year    Active Member of [employer] or Organizations: Yes    Attends Engineer, structural: More than 4 times per year    Marital Status: Married  Catering manager Violence: Not on file   Review of Systems No regular constipation No weight lifting--but did have symptoms after overdoing it in the yard    Objective:   Physical  Exam Genitourinary:    Comments: Large partially thrombosed and very tender hemorrhoid           Assessment & Plan:

## 2022-11-17 NOTE — Telephone Encounter (Signed)
Spoke with pt asking for update on sxs. States he's still in pain but the lidocaine is helping. Says he will keep today's OV with Dr. Alphonsus Sias. I relayed Dr Timoteo Expose message to d/c cream. Pt verbalizes understanding.

## 2023-11-02 ENCOUNTER — Encounter: Payer: Self-pay | Admitting: Family Medicine

## 2023-11-02 ENCOUNTER — Ambulatory Visit (INDEPENDENT_AMBULATORY_CARE_PROVIDER_SITE_OTHER): Admitting: Family Medicine

## 2023-11-02 VITALS — BP 124/82 | HR 64 | Temp 98.8°F | Ht 74.0 in | Wt 237.2 lb

## 2023-11-02 DIAGNOSIS — Z0000 Encounter for general adult medical examination without abnormal findings: Secondary | ICD-10-CM | POA: Diagnosis not present

## 2023-11-02 DIAGNOSIS — Z136 Encounter for screening for cardiovascular disorders: Secondary | ICD-10-CM

## 2023-11-02 DIAGNOSIS — Z131 Encounter for screening for diabetes mellitus: Secondary | ICD-10-CM | POA: Diagnosis not present

## 2023-11-02 DIAGNOSIS — Z125 Encounter for screening for malignant neoplasm of prostate: Secondary | ICD-10-CM

## 2023-11-02 DIAGNOSIS — Z13220 Encounter for screening for lipoid disorders: Secondary | ICD-10-CM | POA: Diagnosis not present

## 2023-11-02 DIAGNOSIS — E66811 Obesity, class 1: Secondary | ICD-10-CM

## 2023-11-02 DIAGNOSIS — L409 Psoriasis, unspecified: Secondary | ICD-10-CM

## 2023-11-02 NOTE — Patient Instructions (Addendum)
 Labs today Consider prevnar-20 and shingrix series.  Schedule eye exam.  Good to see you today Return as needed or in 1 year for next physical.

## 2023-11-02 NOTE — Assessment & Plan Note (Signed)
 Sees derm

## 2023-11-02 NOTE — Progress Notes (Signed)
 Ph: [phone] Fax: [phone]   Patient ID: Cory Costa, male    DOB: 1970/06/12, 53 y.o.   MRN: [id number]  This visit was conducted in person.  BP 124/82   Pulse 64   Temp 98.8 F (37.1 C) (Oral)   Ht 6' 2 (1.88 m)   Wt 237 lb 4 oz (107.6 kg)   SpO2 97%   BMI 30.46 kg/m    CC: CPE Subjective:   HPI: Cory Costa is a 53 y.o. male presenting on 11/02/2023 for Annual Exam   Fasting for labs today.  Psoriasis - followed yearly by dermatology Andrea Clause. Last seen earlier in the year. Uses TCI ointment PRN for this and eczema.   Preventative: Colonoscopy 03/2016 - int hem, WNL ([facility]) Prostate cancer screening - yearly PSA, nocturia x1.  Lung cancer screening - not due Flu shot - declined COVID shot - declined Tdap 10/2017  Pneumonia shot - discussed, to consider  Shingrix - discussed, to consider  Advanced directive discussion -  Seat belt use discussed Sunscreen use discussed. No changing moles on skin. Sees derm yearly.  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 Interim medical history since our last visit reviewed. Allergies and medications reviewed and updated. Outpatient Medications Prior to Visit  Medication Sig Dispense Refill   triamcinolone ointment (KENALOG) 0.1 % Apply 1 Application topically 2 (two) times daily.     hydrocortisone  (ANUSOL -HC) 2.5 % rectal cream Place 1 Application rectally 2 (two) times daily as needed for hemorrhoids or anal itching. (Patient not taking: Reported on 11/02/2023) 30 g 1   lidocaine  (XYLOCAINE ) 5 % ointment  Apply 1 Application topically as needed. (Patient not taking: Reported on 11/02/2023) 35.44 g 0   No facility-administered medications prior to visit.     Per HPI unless specifically indicated in ROS section below Review of Systems  Constitutional:  Negative for activity change, appetite change, chills, fatigue, fever and unexpected weight change.  HENT:  Negative for hearing loss.   Eyes:  Negative for visual disturbance.  Respiratory:  Negative for cough, chest tightness, shortness of breath and wheezing.   Cardiovascular:  Negative for chest pain, palpitations and leg swelling.  Gastrointestinal:  Positive for blood in stool (rare - with wiping, known hemorrhoid). Negative for abdominal distention, abdominal pain, constipation, diarrhea, nausea and vomiting.  Genitourinary:  Negative for difficulty urinating and hematuria.  Musculoskeletal:  Negative for arthralgias, myalgias and neck pain.  Skin:  Negative for rash.  Neurological:  Negative for dizziness, seizures, syncope and headaches.  Hematological:  Negative for adenopathy. Does not bruise/bleed easily.  Psychiatric/Behavioral:  Negative for dysphoric mood. The patient is not nervous/anxious.     Objective:  BP 124/82   Pulse 64   Temp 98.8 F (37.1 C) (Oral)   Ht 6' 2 (1.88 m)   Wt 237 lb 4 oz (107.6 kg)   SpO2 97%   BMI 30.46 kg/m   Wt Readings from Last 3 Encounters:  11/02/23 237 lb 4 oz (107.6 kg)  11/17/22 231 lb (104.8 kg)  11/14/22 235 lb (106.6 kg)  Physical Exam Vitals and nursing note reviewed.  Constitutional:      General: He is not in acute distress.    Appearance: Normal appearance. He is well-developed. He is not ill-appearing.  HENT:     Head: Normocephalic and atraumatic.     Right Ear: Hearing, tympanic membrane, ear canal and external ear normal.     Left Ear: Hearing, tympanic membrane, ear canal and external ear normal.     Mouth/Throat:     Mouth: Mucous membranes are moist.      Pharynx: Oropharynx is clear. No oropharyngeal exudate or posterior oropharyngeal erythema.  Eyes:     General: No scleral icterus.    Extraocular Movements: Extraocular movements intact.     Conjunctiva/sclera: Conjunctivae normal.     Pupils: Pupils are equal, round, and reactive to light.  Neck:     Thyroid: No thyroid mass or thyromegaly.     Vascular: No carotid bruit.  Cardiovascular:     Rate and Rhythm: Normal rate and regular rhythm.     Pulses: Normal pulses.          Radial pulses are 2+ on the right side and 2+ on the left side.     Heart sounds: Normal heart sounds. No murmur heard. Pulmonary:     Effort: Pulmonary effort is normal. No respiratory distress.     Breath sounds: Normal breath sounds. No wheezing, rhonchi or rales.  Abdominal:     General: Bowel sounds are normal. There is no distension.     Palpations: Abdomen is soft. There is no mass.     Tenderness: There is no abdominal tenderness. There is no guarding or rebound.     Hernia: No hernia is present.  Musculoskeletal:        General: Normal range of motion.     Cervical back: Normal range of motion and neck supple.     Right lower leg: No edema.     Left lower leg: No edema.  Lymphadenopathy:     Cervical: No cervical adenopathy.  Skin:    General: Skin is warm and dry.     Findings: No rash.  Neurological:     General: No focal deficit present.     Mental Status: He is alert and oriented to person, place, and time.  Psychiatric:        Mood and Affect: Mood normal.        Behavior: Behavior normal.        Thought Content: Thought content normal.        Judgment: Judgment normal.        Assessment & Plan:   Problem List Items Addressed This Visit     Health maintenance examination - Primary (Chronic)   Preventative protocols reviewed and updated unless pt declined. Discussed healthy diet and lifestyle.       Psoriasis   Sees derm.       Obesity, Class I, BMI 30-34.9   Encouraged  healthy diet and lifestyle changes to effect sustainable weight loss.        Other Visit Diagnoses       Special screening for malignant neoplasm of prostate       Relevant Orders   PSA     Encounter for lipid screening for cardiovascular disease       Relevant Orders   Lipid panel     Diabetes mellitus screening       Relevant Orders   Basic metabolic panel with  GFR        No orders of the defined types were placed in this encounter.   Orders Placed This Encounter  Procedures   Lipid panel   Basic metabolic panel with GFR   PSA    Patient Instructions  Labs today Consider prevnar-20 and shingrix series.  Schedule eye exam.  Good to see you today Return as needed or in 1 year for next physical.   Follow up plan: Return in about 1 year (around 11/01/2024) for annual exam, prior fasting for blood work.  Anton Blas, MD

## 2023-11-02 NOTE — Assessment & Plan Note (Signed)
 Preventative protocols reviewed and updated unless pt declined. Discussed healthy diet and lifestyle.

## 2023-11-02 NOTE — Assessment & Plan Note (Signed)
 Encouraged healthy diet and lifestyle changes to effect sustainable weight loss.

## 2023-11-03 LAB — LIPID PANEL
Chol/HDL Ratio: 2.9 ratio (ref 0.0–5.0)
Cholesterol, Total: 218 mg/dL — ABNORMAL HIGH (ref 100–199)
HDL: 75 mg/dL (ref >39)
LDL Chol Calc (NIH): 132 mg/dL — ABNORMAL HIGH (ref 0–99)
Triglycerides: 61 mg/dL (ref 0–149)
VLDL Cholesterol Cal: 11 mg/dL (ref 5–40)

## 2023-11-03 LAB — BASIC METABOLIC PANEL WITH GFR
BUN/Creatinine Ratio: 10 (ref 9–20)
BUN: 10 mg/dL (ref 6–24)
CO2: 22 mmol/L (ref 20–29)
Calcium: 9.8 mg/dL (ref 8.7–10.2)
Chloride: 102 mmol/L (ref 96–106)
Creatinine, Ser: 0.96 mg/dL (ref 0.76–1.27)
Glucose: 84 mg/dL (ref 70–99)
Potassium: 4.1 mmol/L (ref 3.5–5.2)
Sodium: 139 mmol/L (ref 134–144)
eGFR: 95 mL/min/1.73 (ref >59)

## 2023-11-03 LAB — PSA: Prostate Specific Ag, Serum: 0.5 ng/mL (ref 0.0–4.0)

## 2023-11-05 ENCOUNTER — Ambulatory Visit: Payer: Self-pay | Admitting: Family Medicine

## 2023-11-05 DIAGNOSIS — Z0000 Encounter for general adult medical examination without abnormal findings: Secondary | ICD-10-CM

## 2023-11-06 ENCOUNTER — Other Ambulatory Visit (INDEPENDENT_AMBULATORY_CARE_PROVIDER_SITE_OTHER)

## 2023-11-06 ENCOUNTER — Ambulatory Visit: Payer: Self-pay | Admitting: Family Medicine

## 2023-11-06 DIAGNOSIS — Z0000 Encounter for general adult medical examination without abnormal findings: Secondary | ICD-10-CM

## 2023-11-06 LAB — POCT GLYCOSYLATED HEMOGLOBIN (HGB A1C): Hemoglobin A1C: 5.3 % (ref 4.0–5.6)

## 2023-11-07 NOTE — Telephone Encounter (Signed)
 Can you update POC A1c in chart? Thanks.

## 2024-01-04 ENCOUNTER — Telehealth: Payer: Self-pay | Admitting: Family Medicine

## 2024-01-04 NOTE — Telephone Encounter (Unsigned)
 Copied from CRM [id number]. Topic: General - Other >> Jan 03, 2024  5:16 PM Alfonso ORN wrote: Reason for CRM: pt calling to request a copy of form sent to sent to employer ([employer]) for appt 08/15. He would like this to sent via mychart   Callback: [phone]

## 2024-01-08 NOTE — Telephone Encounter (Signed)
 Called patient request that the health screening form be mailed to verified home address.

## 2024-10-29 ENCOUNTER — Other Ambulatory Visit

## 2024-11-05 ENCOUNTER — Encounter: Admitting: Family Medicine
